# Patient Record
Sex: Male | Born: 1990 | Race: White | Hispanic: No | Marital: Married | Smoking: Former smoker
Health system: Southern US, Community
[De-identification: ages and names within clinical notes are randomized; demographics above are authoritative.]

---
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
## 2008-03-30 ENCOUNTER — Emergency Department: Payer: Self-pay | Admitting: Emergency Medicine

## 2008-09-11 ENCOUNTER — Emergency Department: Payer: Self-pay

## 2008-12-10 ENCOUNTER — Emergency Department: Payer: Self-pay | Admitting: Emergency Medicine

## 2010-02-09 ENCOUNTER — Emergency Department: Payer: Self-pay | Admitting: Emergency Medicine

## 2012-01-28 ENCOUNTER — Emergency Department: Payer: Self-pay | Admitting: Emergency Medicine

## 2012-01-28 LAB — COMPREHENSIVE METABOLIC PANEL
Albumin: 4.5 g/dL (ref 3.4–5.0)
Anion Gap: 10 (ref 7–16)
Bilirubin,Total: 0.3 mg/dL (ref 0.2–1.0)
Chloride: 100 mmol/L (ref 98–107)
EGFR (African American): >60
EGFR (Non-African Amer.): >60
Potassium: 4.1 mmol/L (ref 3.5–5.1)
SGOT(AST): 39 U/L — ABNORMAL HIGH (ref 15–37)
SGPT (ALT): 26 U/L
Total Protein: 8.3 g/dL — ABNORMAL HIGH (ref 6.4–8.2)

## 2012-01-28 LAB — CBC
HCT: 48.9 % (ref 40.0–52.0)
HGB: 16.4 g/dL (ref 13.0–18.0)
MCH: 29.9 pg (ref 26.0–34.0)
Platelet: 231 10*3/uL (ref 150–440)
RBC: 5.49 10*6/uL (ref 4.40–5.90)

## 2012-01-28 LAB — URINALYSIS, COMPLETE
Bacteria: NONE SEEN
Bilirubin,UR: NEGATIVE
Blood: NEGATIVE
Nitrite: NEGATIVE
Ph: 5 (ref 4.5–8.0)
RBC,UR: <1 /HPF (ref 0–5)
Specific Gravity: 1.026 (ref 1.003–1.030)
WBC UR: 1 /HPF (ref 0–5)

## 2012-01-28 LAB — LIPASE, BLOOD: Lipase: 121 U/L (ref 73–393)

## 2012-05-11 ENCOUNTER — Emergency Department: Payer: Self-pay | Admitting: Emergency Medicine

## 2012-05-14 LAB — BETA STREP CULTURE(ARMC)

## 2012-09-19 IMAGING — US ABDOMEN ULTRASOUND LIMITED
1 series · 17 of 25 positions shown · non-contrast
Comparison: none

REASON FOR EXAM: epigastric and rt flank pain + leukocytosis
COMMENTS:   Body Site: GB and Fossa, CBD, Head of Pancreas

[Series 1: abdomen ultrasound limited · 17 of 37 slices shown]
[im 1/37]
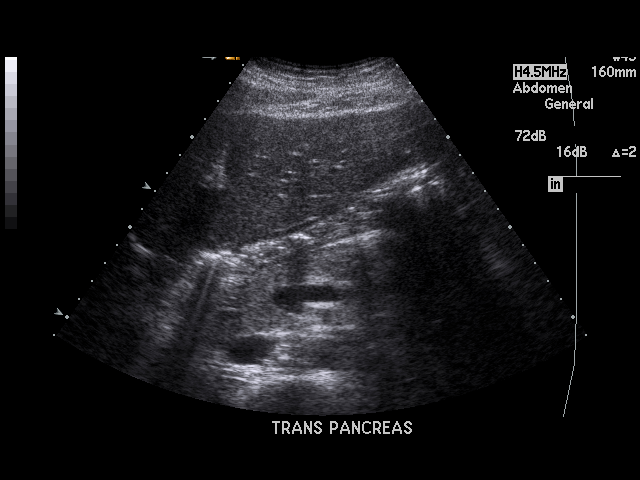
[im 4/37]
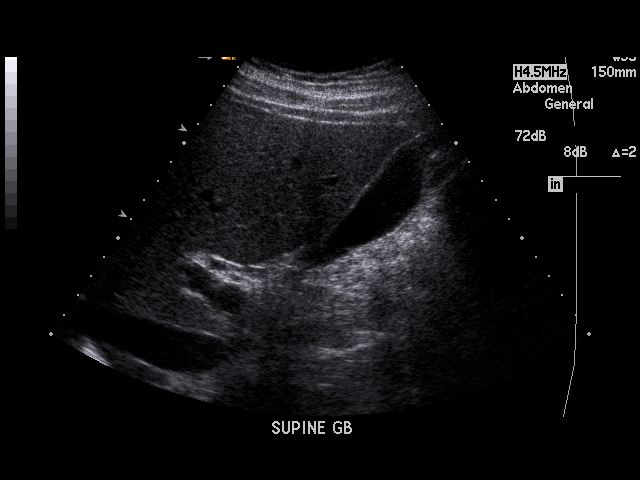
[im 5/37]
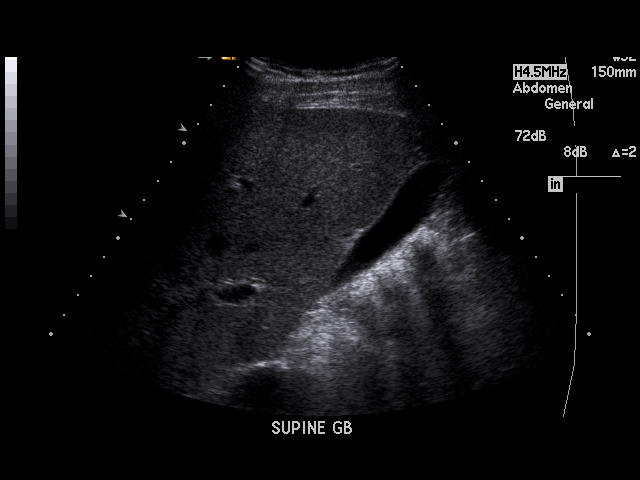
[im 8/37]
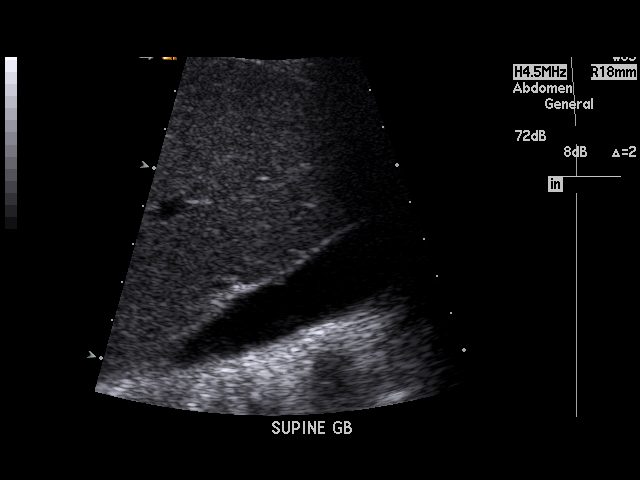
[im 10/37]
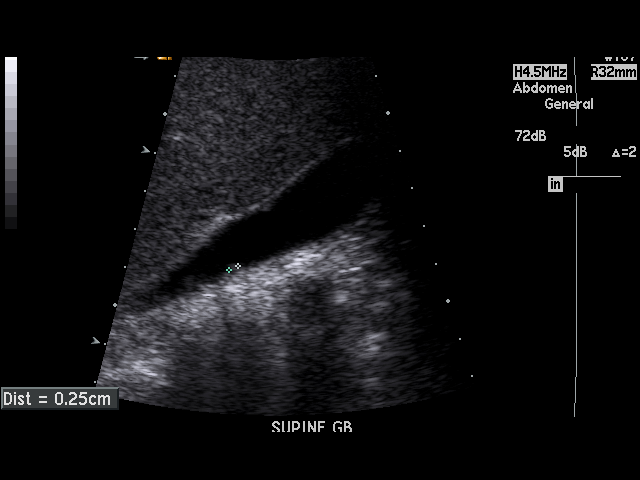
[im 13/37]
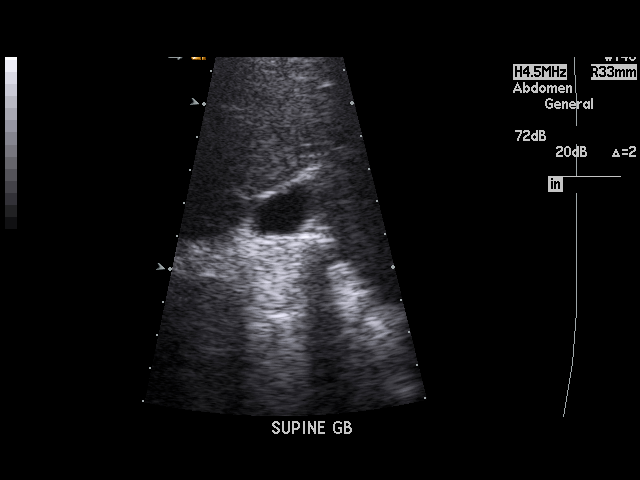
[im 14/37]
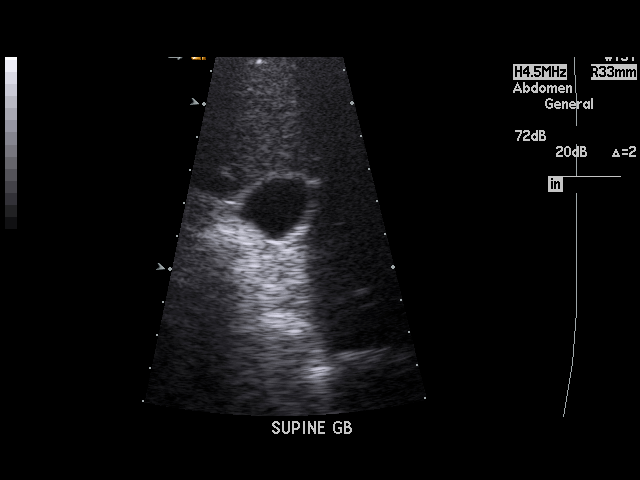
[im 17/37]
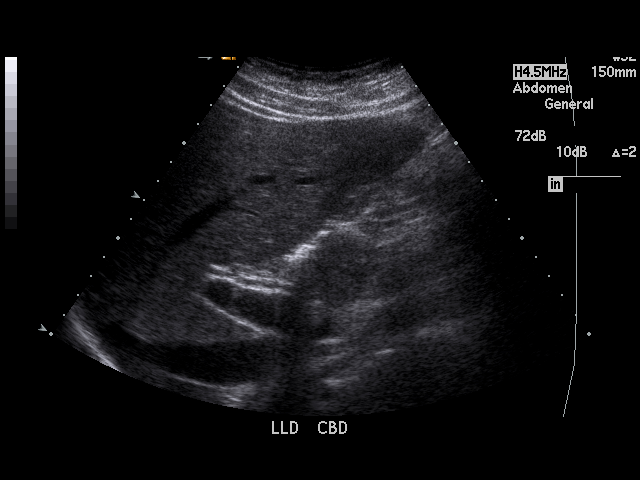
[im 19/37]
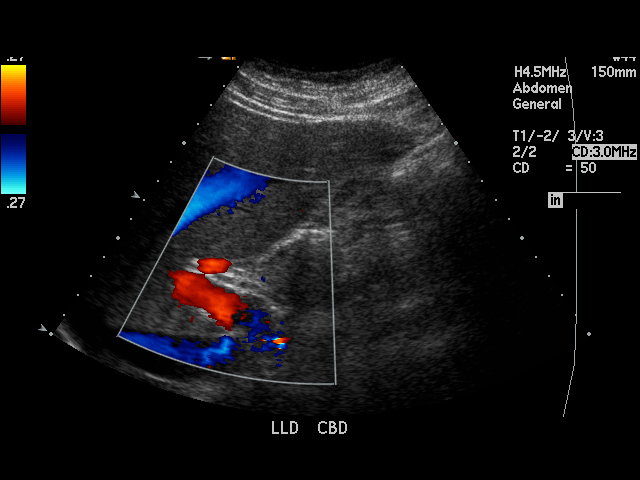
[im 20/37]
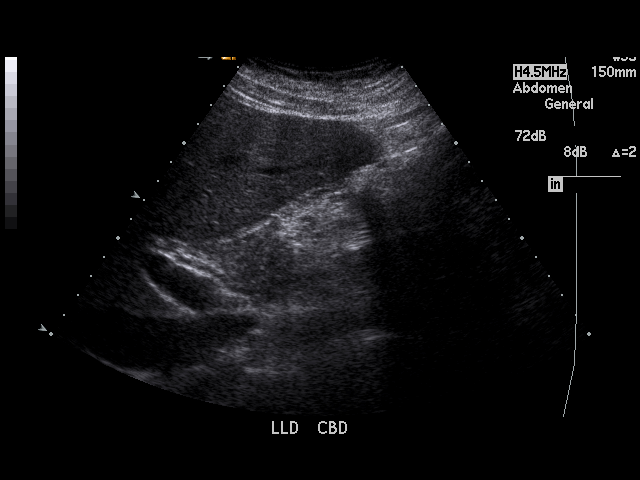
[im 23/37]
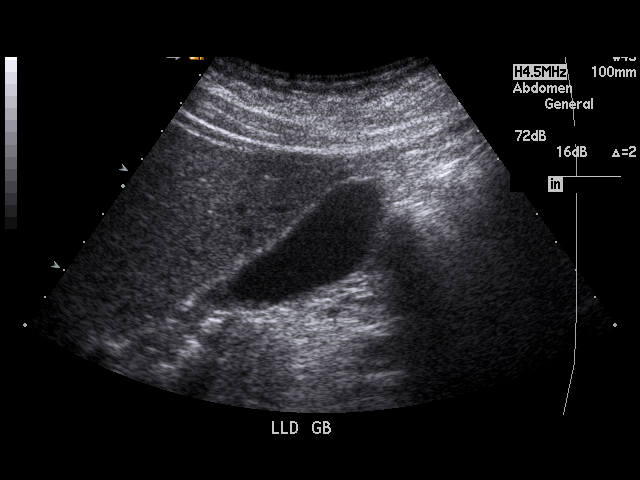
[im 25/37]
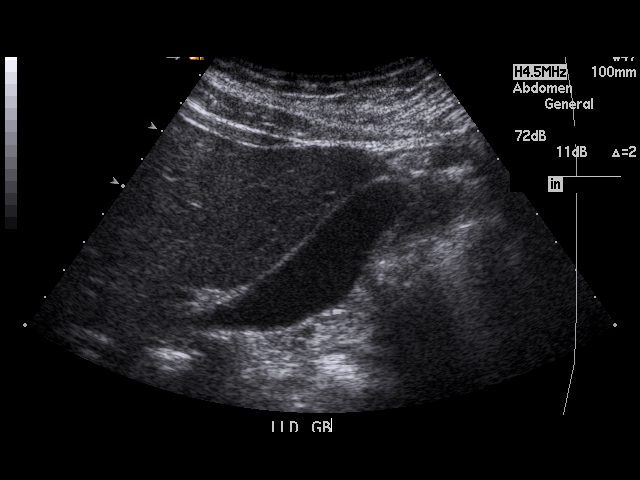
[im 28/37]
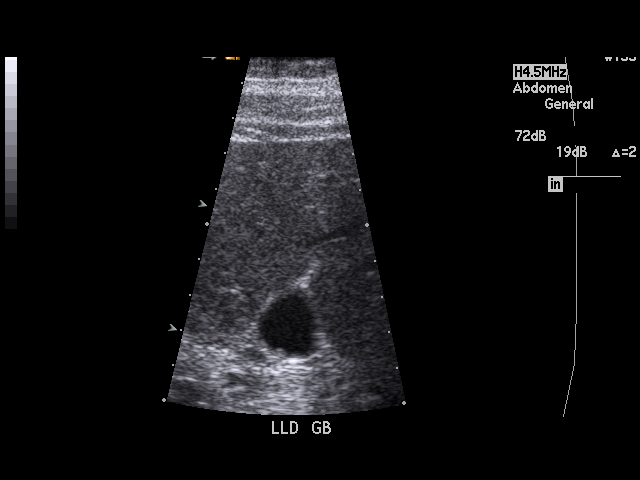
[im 29/37]
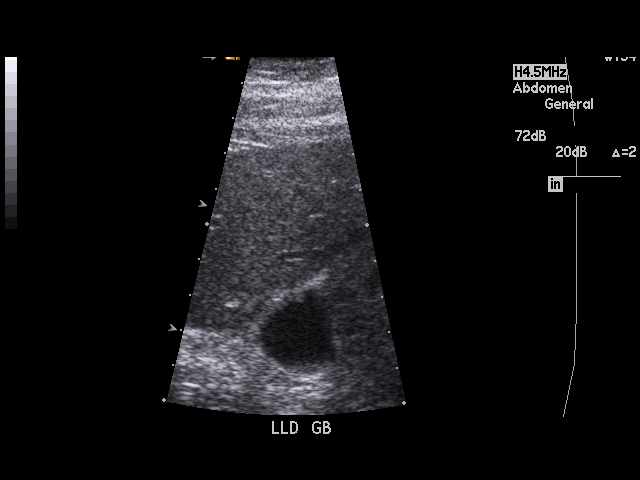
[im 32/37]
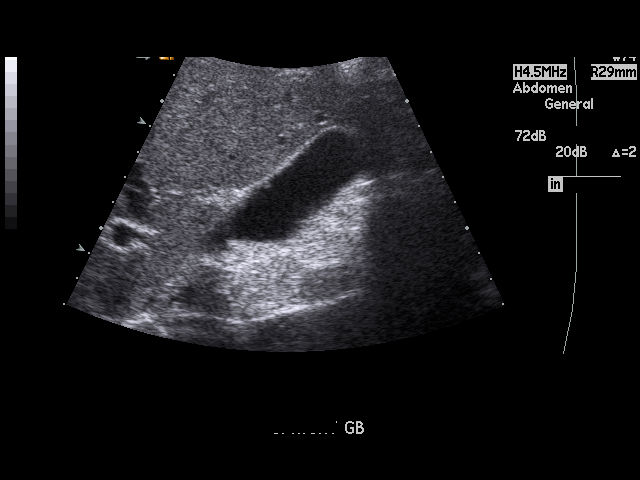
[im 34/37]
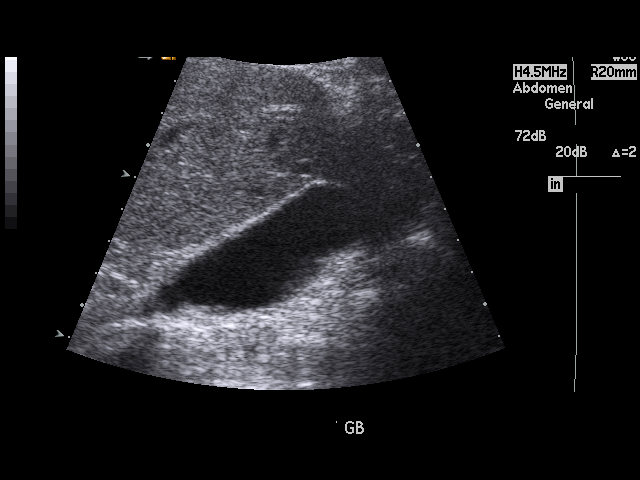
[im 37/37]
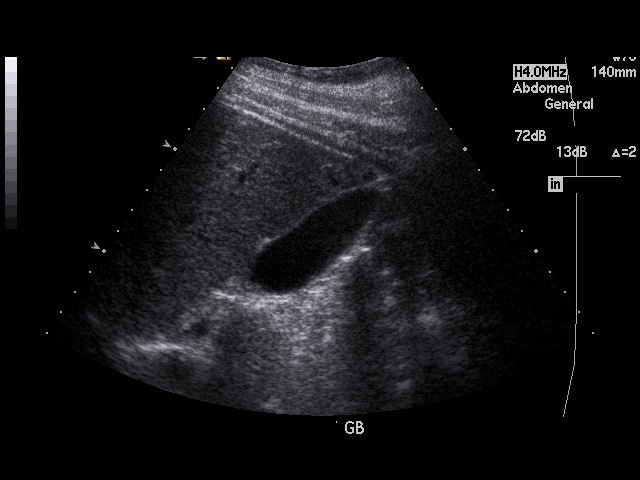

[17 of 25 positions shown; findings below may reference images not displayed]

PROCEDURE:     US  - US ABDOMEN LIMITED SURVEY  - January 29, 2012 [DATE]

RESULT:     Gallbladder ultrasound examination is performed. The study shows
a 3.0 mm nonshadowing echogenic area which is nonmobile along the
nondependent gallbladder surface consistent with a small polyp. There
appears to be some additional scattered similar structures less than 4 mm in
size. No sonographic Murphy's sign is evident. Portal venous flow is normal.
Common bile duct diameter is 3.0 mm. No pericholecystic fluid is present.
The gallbladder wall thickness is 1.4 mm. No sonographic Murphy's sign is
reported.
IMPRESSION: 1.     Multiple polyps.
2.     No cholelithiasis or evidence of acute cholecystitis.
3.     The visualized pancreas is unremarkable.

## 2013-03-30 ENCOUNTER — Emergency Department: Payer: Self-pay | Admitting: Unknown Physician Specialty

## 2013-03-30 LAB — COMPREHENSIVE METABOLIC PANEL
Albumin: 3.6 g/dL (ref 3.4–5.0)
Bilirubin,Total: 0.4 mg/dL (ref 0.2–1.0)
Calcium, Total: 8.6 mg/dL (ref 8.5–10.1)
Chloride: 104 mmol/L (ref 98–107)
Co2: 25 mmol/L (ref 21–32)
Creatinine: 0.9 mg/dL (ref 0.60–1.30)
Glucose: 89 mg/dL (ref 65–99)
Osmolality: 270 (ref 275–301)
SGPT (ALT): 19 U/L (ref 12–78)
Sodium: 136 mmol/L (ref 136–145)
Total Protein: 7.5 g/dL (ref 6.4–8.2)

## 2013-03-30 LAB — CBC
HCT: 39.9 % — ABNORMAL LOW (ref 40.0–52.0)
MCH: 29.3 pg (ref 26.0–34.0)
MCV: 85 fL (ref 80–100)
Platelet: 185 10*3/uL (ref 150–440)
RBC: 4.68 10*6/uL (ref 4.40–5.90)
RDW: 12.8 % (ref 11.5–14.5)

## 2013-03-30 LAB — MONONUCLEOSIS SCREEN: Mono Test: NEGATIVE

## 2013-03-30 LAB — RAPID INFLUENZA A&B ANTIGENS

## 2013-04-01 LAB — BETA STREP CULTURE(ARMC)

## 2013-07-31 ENCOUNTER — Emergency Department: Payer: Self-pay | Admitting: Emergency Medicine

## 2013-07-31 LAB — COMPREHENSIVE METABOLIC PANEL
Albumin: 4.2 g/dL (ref 3.4–5.0)
Anion Gap: 8 (ref 7–16)
BUN: 13 mg/dL (ref 7–18)
Bilirubin,Total: 0.3 mg/dL (ref 0.2–1.0)
Calcium, Total: 8.7 mg/dL (ref 8.5–10.1)
Co2: 26 mmol/L (ref 21–32)
EGFR (African American): >60
EGFR (Non-African Amer.): >60
Glucose: 114 mg/dL — ABNORMAL HIGH (ref 65–99)
Osmolality: 279 (ref 275–301)
SGOT(AST): 26 U/L (ref 15–37)
SGPT (ALT): 21 U/L (ref 12–78)
Sodium: 139 mmol/L (ref 136–145)
Total Protein: 7.7 g/dL (ref 6.4–8.2)

## 2013-07-31 LAB — URINALYSIS, COMPLETE
Ketone: NEGATIVE
Leukocyte Esterase: NEGATIVE
Ph: 6 (ref 4.5–8.0)
Protein: NEGATIVE
RBC,UR: <1 /HPF (ref 0–5)
Specific Gravity: 1.026 (ref 1.003–1.030)
Squamous Epithelial: <1

## 2013-07-31 LAB — CBC
HCT: 42.8 % (ref 40.0–52.0)
MCH: 29.2 pg (ref 26.0–34.0)
MCHC: 34.4 g/dL (ref 32.0–36.0)
Platelet: 216 10*3/uL (ref 150–440)
RDW: 13 % (ref 11.5–14.5)

## 2013-08-01 LAB — WBCS, STOOL

## 2013-08-03 LAB — STOOL CULTURE

## 2014-07-11 ENCOUNTER — Ambulatory Visit: Payer: Self-pay | Admitting: Family Medicine

## 2015-07-10 ENCOUNTER — Emergency Department
Admission: EM | Admit: 2015-07-10 | Discharge: 2015-07-10 | Disposition: A | Discharge status: Home / Self Care | Payer: BLUE CROSS/BLUE SHIELD | Attending: Emergency Medicine | Admitting: Emergency Medicine

## 2015-07-10 ENCOUNTER — Emergency Department: Payer: BLUE CROSS/BLUE SHIELD

## 2015-07-10 ENCOUNTER — Encounter: Payer: Self-pay | Admitting: Emergency Medicine

## 2015-07-10 DIAGNOSIS — S39012A Strain of muscle, fascia and tendon of lower back, initial encounter: Secondary | ICD-10-CM | POA: Diagnosis not present

## 2015-07-10 DIAGNOSIS — M545 Low back pain: Secondary | ICD-10-CM | POA: Diagnosis present

## 2015-07-10 DIAGNOSIS — Y998 Other external cause status: Secondary | ICD-10-CM | POA: Insufficient documentation

## 2015-07-10 DIAGNOSIS — Y9289 Other specified places as the place of occurrence of the external cause: Secondary | ICD-10-CM | POA: Diagnosis not present

## 2015-07-10 DIAGNOSIS — Y9389 Activity, other specified: Secondary | ICD-10-CM | POA: Insufficient documentation

## 2015-07-10 DIAGNOSIS — X58XXXA Exposure to other specified factors, initial encounter: Secondary | ICD-10-CM | POA: Insufficient documentation

## 2015-07-10 DIAGNOSIS — Z720 Tobacco use: Secondary | ICD-10-CM | POA: Insufficient documentation

## 2015-07-10 MED ORDER — IBUPROFEN 800 MG PO TABS: 800.0000 mg | ORAL_TABLET | Freq: Three times a day (TID) | ORAL | Status: DC | PRN

## 2015-07-10 MED ORDER — CYCLOBENZAPRINE HCL 10 MG PO TABS: 10.0000 mg | ORAL_TABLET | Freq: Three times a day (TID) | ORAL | Status: DC | PRN

## 2015-07-10 NOTE — Discharge Instructions (Signed)
Back Exercises °Back exercises help treat and prevent back injuries. The goal of back exercises is to increase the strength of your abdominal and back muscles and the flexibility of your back. These exercises should be started when you no longer have back pain. Back exercises include: °· Pelvic Tilt. Lie on your back with your knees bent. Tilt your pelvis until the lower part of your back is against the floor. Hold this position 5 to 10 sec and repeat 5 to 10 times. °· Knee to Chest. Pull first 1 knee up against your chest and hold for 20 to 30 seconds, repeat this with the other knee, and then both knees. This may be done with the other leg straight or bent, whichever feels better. °· Sit-Ups or Curl-Ups. Bend your knees 90 degrees. Start with tilting your pelvis, and do a partial, slow sit-up, lifting your trunk only 30 to 45 degrees off the floor. Take at least 2 to 3 seconds for each sit-up. Do not do sit-ups with your knees out straight. If partial sit-ups are difficult, simply do the above but with only tightening your abdominal muscles and holding it as directed. °· Hip-Lift. Lie on your back with your knees flexed 90 degrees. Push down with your feet and shoulders as you raise your hips a couple inches off the floor; hold for 10 seconds, repeat 5 to 10 times. °· Back arches. Lie on your stomach, propping yourself up on bent elbows. Slowly press on your hands, causing an arch in your low back. Repeat 3 to 5 times. Any initial stiffness and discomfort should lessen with repetition over time. °· Shoulder-Lifts. Lie face down with arms beside your body. Keep hips and torso pressed to floor as you slowly lift your head and shoulders off the floor. °Do not overdo your exercises, especially in the beginning. Exercises may cause you some mild back discomfort which lasts for a few minutes; however, if the pain is more severe, or lasts for more than 15 minutes, do not continue exercises until you see your caregiver.  Improvement with exercise therapy for back problems is slow.  °See your caregivers for assistance with developing a proper back exercise program. °Document Released: 11/24/2004 Document Revised: 01/09/2012 Document Reviewed: 08/18/2011 °ExitCare® Patient Information ©2015 [employer]. This information is not intended to replace advice given to you by your health care provider. Make sure you discuss any questions you have with your health care provider. ° °Back Pain, Adult °Low back pain is very common. About 1 in 5 people have back pain. The cause of low back pain is rarely dangerous. The pain often gets better over time. About half of people with a sudden onset of back pain feel better in just 2 weeks. About 8 in 10 people feel better by 6 weeks.  °CAUSES °Some common causes of back pain include: °· Strain of the muscles or ligaments supporting the spine. °· Wear and tear (degeneration) of the spinal discs. °· Arthritis. °· Direct injury to the back. °DIAGNOSIS °Most of the time, the direct cause of low back pain is not known. However, back pain can be treated effectively even when the exact cause of the pain is unknown. Answering your caregiver's questions about your overall health and symptoms is one of the most accurate ways to make sure the cause of your pain is not dangerous. If your caregiver needs more information, he or she may order lab work or imaging tests (X-rays or MRIs). However, even if imaging tests show changes in your   back, this usually does not require surgery. °HOME CARE INSTRUCTIONS °For many people, back pain returns. Since low back pain is rarely dangerous, it is often a condition that people can learn to manage on their own.  °· Remain active. It is stressful on the back to sit or stand in one place. Do not sit, drive, or stand in one place for more than 30 minutes at a time. Take short walks on level surfaces as soon as pain allows. Try to increase the length of time you walk each  day. °· Do not stay in bed. Resting more than 1 or 2 days can delay your recovery. °· Do not avoid exercise or work. Your body is made to move. It is not dangerous to be active, even though your back may hurt. Your back will likely heal faster if you return to being active before your pain is gone. °· Pay attention to your body when you  bend and lift. Many people have less discomfort when lifting if they bend their knees, keep the load close to their bodies, and avoid twisting. Often, the most comfortable positions are those that put less stress on your recovering back. °· Find a comfortable position to sleep. Use a firm mattress and lie on your side with your knees slightly bent. If you lie on your back, put a pillow under your knees. °· Only take over-the-counter or prescription medicines as directed by your caregiver. Over-the-counter medicines to reduce pain and inflammation are often the most helpful. Your caregiver may prescribe muscle relaxant drugs. These medicines help dull your pain so you can more quickly return to your normal activities and healthy exercise. °· Put ice on the injured area. °¨ Put ice in a plastic bag. °¨ Place a towel between your skin and the bag. °¨ Leave the ice on for 15-20 minutes, 03-04 times a day for the first 2 to 3 days. After that, ice and heat may be alternated to reduce pain and spasms. °· Ask your caregiver about trying back exercises and gentle massage. This may be of some benefit. °· Avoid feeling anxious or stressed. Stress increases muscle tension and can worsen back pain. It is important to recognize when you are anxious or stressed and learn ways to manage it. Exercise is a great option. °SEEK MEDICAL CARE IF: °· You have pain that is not relieved with rest or medicine. °· You have pain that does not improve in 1 week. °· You have new symptoms. °· You are generally not feeling well. °SEEK IMMEDIATE MEDICAL CARE IF:  °· You have pain that radiates from your back into  your legs. °· You develop new bowel or bladder control problems. °· You have unusual weakness or numbness in your arms or legs. °· You develop nausea or vomiting. °· You develop abdominal pain. °· You feel faint. °Document Released: 10/17/2005 Document Revised: 04/17/2012 Document Reviewed: 02/18/2014 °ExitCare® Patient Information ©2015 [employer]. This information is not intended to replace advice given to you by your health care provider. Make sure you discuss any questions you have with your health care provider. ° °Lumbosacral Strain °Lumbosacral strain is a strain of any of the parts that make up your lumbosacral vertebrae. Your lumbosacral vertebrae are the bones that make up the lower third of your backbone. Your lumbosacral vertebrae are held together by muscles and tough, fibrous tissue (ligaments).  °CAUSES  °A sudden blow to your back can cause lumbosacral strain. Also, anything that causes an excessive stretch of the muscles in the low back can cause this strain. This is typically   seen when people exert themselves strenuously, fall, lift heavy objects, bend, or crouch repeatedly. °RISK FACTORS °· Physically demanding work. °· Participation in pushing or pulling sports or sports that require a sudden twist of the back (tennis, golf, baseball). °· Weight lifting. °· Excessive lower back curvature. °· Forward-tilted pelvis. °· Weak back or abdominal muscles or both. °· Tight hamstrings. °SIGNS AND SYMPTOMS  °Lumbosacral strain may cause pain in the area of your injury or pain that moves (radiates) down your leg.  °DIAGNOSIS °Your health care provider can often diagnose lumbosacral strain through a physical exam. In some cases, you may need tests such as X-ray exams.  °TREATMENT  °Treatment for your lower back injury depends on many factors that your clinician will have to evaluate. However, most treatment will include the use of anti-inflammatory medicines. °HOME CARE INSTRUCTIONS  °· Avoid hard  physical activities (tennis, racquetball, waterskiing) if you are not in proper physical condition for it. This may aggravate or create problems. °· If you have a back problem, avoid sports requiring sudden body movements. Swimming and walking are generally safer activities. °· Maintain good posture. °· Maintain a healthy weight. °· For acute conditions, you may put ice on the injured area. °¨ Put ice in a plastic bag. °¨ Place a towel between your skin and the bag. °¨ Leave the ice on for 20 minutes, 2-3 times a day. °· When the low back starts healing, stretching and strengthening exercises may be recommended. °SEEK MEDICAL CARE IF: °· Your back pain is getting worse. °· You experience severe back pain not relieved with medicines. °SEEK IMMEDIATE MEDICAL CARE IF:  °· You have numbness, tingling, weakness, or problems with the use of your arms or legs. °· There is a change in bowel or bladder control. °· You have increasing pain in any area of the body, including your belly (abdomen). °· You notice shortness of breath, dizziness, or feel faint. °· You feel sick to your stomach (nauseous), are throwing up (vomiting), or become sweaty. °· You notice discoloration of your toes or legs, or your feet get very cold. °MAKE SURE YOU:  °· Understand these instructions. °· Will watch your condition. °· Will get help right away if you are not doing well or get worse. °Document Released: 07/27/2005 Document Revised: 10/22/2013 Document Reviewed: 06/05/2013 °ExitCare® Patient Information ©2015 [employer]. This information is not intended to replace advice given to you by your health care provider. Make sure you discuss any questions you have with your health care provider. ° °

## 2015-07-10 NOTE — ED Notes (Signed)
Reports lower back pain, worse with movement. Ambulates well.

## 2015-07-10 NOTE — ED Notes (Signed)
Developed lower back pain about 1 month ago with unknown injury  Ambulates well to treatment area

## 2015-07-10 NOTE — ED Provider Notes (Signed)
[facility] Emergency Department Provider Note  ____________________________________________  Time seen: Approximately 10:34 AM  I have reviewed the triage vital signs and the nursing notes.   HISTORY  Chief Complaint Back Pain    HPI Craig Frazier is a 24 y.o. male who presents with a month-long history of low back pain. Patient states he does not like to go to the doctor so hasn't had it checked out and needs a referral to a specialist for his insurance. Denies any direct trauma. 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   History reviewed. No pertinent past medical history.  There are no active problems to display for this patient.   History reviewed. No pertinent past surgical history.  Current Outpatient Rx  Name  Route  Sig  Dispense  Refill  . cyclobenzaprine (FLEXERIL) 10 MG tablet   Oral   Take 1 tablet (10 mg total) by mouth every 8 (eight) hours as needed for muscle spasms.   30 tablet   1   . ibuprofen (ADVIL,MOTRIN) 800 MG tablet   Oral   Take 1 tablet (800 mg total) by mouth every 8 (eight) hours as needed.   30 tablet   0     Allergies Review of patient's allergies indicates no known allergies.  History reviewed. No pertinent family history.  Social History Social History  Substance Use Topics  . Smoking status: Current Some Day Smoker  . Smokeless tobacco: None  . Alcohol Use: No    Review of Systems Constitutional: No fever/chills Eyes: No visual changes. ENT: No sore throat. Cardiovascular: Denies chest pain. Respiratory: Denies shortness of breath. Gastrointestinal: No abdominal pain.  No nausea, no vomiting.  No diarrhea.  No constipation. Genitourinary: Negative for dysuria. Musculoskeletal: Positive for low back pain. Skin: Negative for rash. Neurological: Negative for headaches, focal weakness or numbness.  10-point ROS otherwise  negative.  ____________________________________________   PHYSICAL EXAM:  VITAL SIGNS: ED Triage Vitals  Enc Vitals Group     BP 07/10/15 0942 143/74 mmHg     Pulse Rate 07/10/15 0942 86     Resp 07/10/15 0942 18     Temp 07/10/15 0942 98.4 F (36.9 C)     Temp Source 07/10/15 0942 Oral     SpO2 07/10/15 0942 100 %     Weight 07/10/15 0937 240 lb (108.863 kg)     Height 07/10/15 0937  (1.854 m)     Head Cir --      Peak Flow --      Pain Score 07/10/15 0938 7     Pain Loc --      Pain Edu? --      Excl. in GC? --     Constitutional: Alert and oriented. Well appearing and in no acute distress. Cardiovascular: Normal rate, regular rhythm. Grossly normal heart sounds.  Good peripheral circulation. Respiratory: Normal respiratory effort.  No retractions. Lungs CTAB. Musculoskeletal: No lower extremity tenderness nor edema.  No joint effusions. Paraspinal tenderness in the lumbar area. Straight leg raise negative. Negative spinal tenderness. Neurologic:  Normal speech and language. No gross focal neurologic deficits are appreciated. No gait instability. Skin:  Skin is warm, dry and intact. No rash noted. Psychiatric: Mood and affect are normal. Speech and behavior are normal.  ____________________________________________   LABS (all labs ordered are listed, but only abnormal results are displayed)  Labs Reviewed - No data to display  RADIOLOGY  Negative  for fracture dislocationchanges. ____________________________________________   PROCEDURES  Procedure(s) performed: None  Critical Care performed: No  ____________________________________________   INITIAL IMPRESSION / ASSESSMENT AND PLAN / ED COURSE  Pertinent labs & imaging results that were available during my care of the patient were reviewed by me and considered in my medical decision making (see chart for details).  Acute lumbar sacral strain. Rx given for Motrin 800 mg 3 times a day, Flexeril 10 mg  3 times a day referral given to orthopedic on call at patient's request. Work note given times one day.  Patient voices no other emergency medical complaints at this time. ____________________________________________   FINAL CLINICAL IMPRESSION(S) / ED DIAGNOSES  Final diagnoses:  Lumbar strain, initial encounter      Evangeline Dakin, PA-C 07/10/15 1204  Sharman Cheek, MD 07/10/15 [phone]

## 2019-11-13 ENCOUNTER — Ambulatory Visit: Payer: BLUE CROSS/BLUE SHIELD | Attending: Internal Medicine

## 2020-10-30 ENCOUNTER — Emergency Department: Payer: BC Managed Care – PPO

## 2020-10-30 ENCOUNTER — Other Ambulatory Visit: Payer: Self-pay

## 2020-10-30 ENCOUNTER — Emergency Department
Admission: EM | Admit: 2020-10-30 | Discharge: 2020-10-30 | Disposition: A | Discharge status: Home / Self Care | Payer: BC Managed Care – PPO | Attending: Student in an Organized Health Care Education/Training Program | Admitting: Student in an Organized Health Care Education/Training Program

## 2020-10-30 DIAGNOSIS — U071 COVID-19: Secondary | ICD-10-CM | POA: Diagnosis not present

## 2020-10-30 DIAGNOSIS — F17200 Nicotine dependence, unspecified, uncomplicated: Secondary | ICD-10-CM | POA: Diagnosis not present

## 2020-10-30 DIAGNOSIS — K449 Diaphragmatic hernia without obstruction or gangrene: Secondary | ICD-10-CM | POA: Insufficient documentation

## 2020-10-30 DIAGNOSIS — R509 Fever, unspecified: Secondary | ICD-10-CM | POA: Diagnosis present

## 2020-10-30 LAB — POC SARS CORONAVIRUS 2 AG -  ED
SARS Coronavirus 2 Ag: NEGATIVE
SARS Coronavirus 2 Ag: NEGATIVE

## 2020-10-30 LAB — RESP PANEL BY RT-PCR (FLU A&B, COVID) ARPGX2
Influenza A by PCR: NEGATIVE
Influenza B by PCR: NEGATIVE
SARS Coronavirus 2 by RT PCR: POSITIVE — AB

## 2020-10-30 MED ORDER — IBUPROFEN 600 MG PO TABS: 600.0000 mg | ORAL_TABLET | Freq: Four times a day (QID) | ORAL | 0 refills | Status: DC | PRN

## 2020-10-30 MED ORDER — BENZONATATE 100 MG PO CAPS: 100.0000 mg | ORAL_CAPSULE | Freq: Three times a day (TID) | ORAL | 0 refills | Status: AC | PRN

## 2020-10-30 MED ORDER — IBUPROFEN 400 MG PO TABS
400.0000 mg | ORAL_TABLET | Freq: Once | ORAL | Status: AC
  Administered 2020-10-30: 400 mg via ORAL
  Filled 2020-10-30: qty 1

## 2020-10-30 NOTE — ED Triage Notes (Signed)
Pt presents via POV c/o fever x2 days, Reports fever this am at 0400 of 103. Reports took Tylenol at 0400.

## 2020-10-30 NOTE — ED Provider Notes (Signed)
[facility] Emergency Department Provider Note  ____________________________________________  Time seen: Approximately 11:13 AM  I have reviewed the triage vital signs and the nursing notes.   HISTORY  Chief Complaint Fever    HPI Craig Frazier is a 29 y.o. male that presents to the emergency department for evaluation of fever, headache, shortness of breath since yesterday.  Patient states fever in the middle of the night was 103.  Patient's throat is "dry" but not sore.  Patient had Lyme's disease 7 years ago and wondering if he caught it again. No tick bites.  No sick contacts.  He has not had a Covid vaccination.  No nasal congestion, sore throat, cough, vomiting, diarrhea.  History reviewed. No pertinent past medical history.  There are no problems to display for this patient.   History reviewed. No pertinent surgical history.  Prior to Admission medications   Medication Sig Start Date End Date Taking? Authorizing Provider  benzonatate (TESSALON PERLES) 100 MG capsule Take 1 capsule (100 mg total) by mouth 3 (three) times daily as needed. 10/30/20 10/30/21 Yes Enid Derry, PA-C  ibuprofen (ADVIL) 600 MG tablet Take 1 tablet (600 mg total) by mouth every 6 (six) hours as needed. 10/30/20  Yes Enid Derry, PA-C  cyclobenzaprine (FLEXERIL) 10 MG tablet Take 1 tablet (10 mg total) by mouth every 8 (eight) hours as needed for muscle spasms. 07/10/15   Beers, Charmayne Sheer, PA-C    Allergies Patient has no known allergies.  History reviewed. No pertinent family history.  Social History Social History   Tobacco Use  . Smoking status: Current Some Day Smoker  . Smokeless tobacco: Current User    Types: Snuff  Vaping Use  . Vaping Use: Every day  Substance Use Topics  . Alcohol use: No     Review of Systems  Constitutional: Positive for fever/chills Eyes: No visual changes. No discharge. ENT: Positive for congestion and  rhinorrhea. Cardiovascular: No chest pain. Respiratory: Negative for cough. Positive for SOB. Gastrointestinal: No abdominal pain.  No nausea, no vomiting.  No diarrhea.  No constipation. Musculoskeletal: Negative for musculoskeletal pain. Skin: Negative for rash, abrasions, lacerations, ecchymosis. Neurological: Positive for headache.   ____________________________________________   PHYSICAL EXAM:  VITAL SIGNS: ED Triage Vitals  Enc Vitals Group     BP 10/30/20 0942 129/74     Pulse Rate 10/30/20 0942 66     Resp 10/30/20 0942 20     Temp 10/30/20 0942 (!) 100.6 F (38.1 C)     Temp Source 10/30/20 0942 Oral     SpO2 10/30/20 0942 97 %     Weight 10/30/20 0943 270 lb (122.5 kg)     Height 10/30/20 0943 6\' 6"  (1.981 m)     Head Circumference --      Peak Flow --      Pain Score 10/30/20 0946 7     Pain Loc --      Pain Edu? --      Excl. in GC? --      Constitutional: Alert and oriented. Well appearing and in no acute distress. Eyes: Conjunctivae are normal. PERRL. EOMI. No discharge. Head: Atraumatic. ENT: No frontal and maxillary sinus tenderness.      Ears: Tympanic membranes pearly gray with good landmarks. No discharge.      Nose: No congestion/rhinnorhea.      Mouth/Throat: Mucous membranes are moist. Oropharynx non-erythematous. Tonsils not enlarged. No exudates. Uvula midline. Neck: No stridor.   Hematological/Lymphatic/Immunilogical:  No cervical lymphadenopathy. Cardiovascular: Normal rate, regular rhythm.  Good peripheral circulation. Respiratory: Normal respiratory effort without tachypnea or retractions. Lungs CTAB. Good air entry to the bases with no decreased or absent breath sounds. Gastrointestinal: Bowel sounds 4 quadrants. Soft and nontender to palpation. No guarding or rigidity. No palpable masses. No distention. Musculoskeletal: Full range of motion to all extremities. No gross deformities appreciated. Neurologic:  Normal speech and language. No  gross focal neurologic deficits are appreciated.  Skin:  Skin is warm, dry and intact. No rash noted. Psychiatric: Mood and affect are normal. Speech and behavior are normal. Patient exhibits appropriate insight and judgement.   ____________________________________________   LABS (all labs ordered are listed, but only abnormal results are displayed)  Labs Reviewed  RESP PANEL BY RT-PCR (FLU A&B, COVID) ARPGX2 - Abnormal; Notable for the following components:      Result Value   SARS Coronavirus 2 by RT PCR POSITIVE (*)    All other components within normal limits  POC SARS CORONAVIRUS 2 AG -  ED   ____________________________________________  EKG   ____________________________________________  RADIOLOGY Lexine Baton, personally viewed and evaluated these images (plain radiographs) as part of my medical decision making, as well as reviewing the written report by the radiologist.  DG Chest 1 View  Result Date: 10/30/2020 CLINICAL DATA:  Onset cough, headache, fever and shortness of breath yesterday. EXAM: CHEST  1 VIEW COMPARISON:  None. FINDINGS: The lungs are clear. Heart size is normal. Hiatal hernia noted. No pneumothorax or pleural effusion. No acute or focal bony abnormality. IMPRESSION: No acute disease. Hiatal hernia. Electronically Signed   By: Drusilla Kanner M.D.   On: 10/30/2020 11:30    ____________________________________________    PROCEDURES  Procedure(s) performed:    Procedures    Medications  ibuprofen (ADVIL) tablet 400 mg (400 mg Oral Given 10/30/20 0950)     ____________________________________________   INITIAL IMPRESSION / ASSESSMENT AND PLAN / ED COURSE  Pertinent labs & imaging results that were available during my care of the patient were reviewed by me and considered in my medical decision making (see chart for details).  Review of the [place] CSRS was performed in accordance of the [facility] prior to dispensing any controlled  drugs.   Patient's diagnosis is consistent with Covid 19. Vital signs and exam are reassuring. Covid test is positive. Chest xray negative for acute cardiopulmonary processes. Patient appears well and is staying well hydrated. Patient should alternate tylenol and ibuprofen for fever. Patient feels comfortable going home. Patient will be discharged home with prescriptions for tessalon perles and ibuprofen. Patient is to follow up with PCP or urgent care as needed or otherwise directed. Patient is given ED precautions to return to the ED for any worsening or new symptoms.    Craig Frazier was evaluated in Emergency Department on 10/30/2020 for the symptoms described in the history of present illness. He was evaluated in the context of the global COVID-19 pandemic, which necessitated consideration that the patient might be at risk for infection with the SARS-CoV-2 virus that causes COVID-19. Institutional protocols and algorithms that pertain to the evaluation of patients at risk for COVID-19 are in a state of rapid change based on information released by regulatory bodies including the CDC and federal and state organizations. These policies and algorithms were followed during the patient's care in the ED.  ____________________________________________  FINAL CLINICAL IMPRESSION(S) / ED DIAGNOSES  Final diagnoses:  COVID-19      NEW  MEDICATIONS STARTED DURING THIS VISIT:  ED Discharge Orders         Ordered    ibuprofen (ADVIL) 600 MG tablet  Every 6 hours PRN        10/30/20 1219    benzonatate (TESSALON PERLES) 100 MG capsule  3 times daily PRN        10/30/20 1219              This chart was dictated using voice recognition software/Dragon. Despite best efforts to proofread, errors can occur which can change the meaning. Any change was purely unintentional.    Enid Derry, PA-C 10/30/20 1308    Willy Eddy, MD 10/30/20 [phone]

## 2020-10-30 NOTE — ED Notes (Signed)
See triage note. Pt sitting calmly in bed. Skin dry. Resp reg/unlabored. Reports palpitations at times. States last time he felt this terrible he found out he had limes disease and was placed on antibiotics.

## 2020-10-30 NOTE — ED Notes (Signed)
EKG to EDP Kinner in person.  °

## 2021-06-22 IMAGING — DX DG CHEST 1V
1 series · 1 of 1 positions shown · non-contrast
Comparison: None.

CLINICAL DATA: Onset cough, headache, fever and shortness of breath
yesterday.

EXAM:
CHEST  1 VIEW

[chest ap]
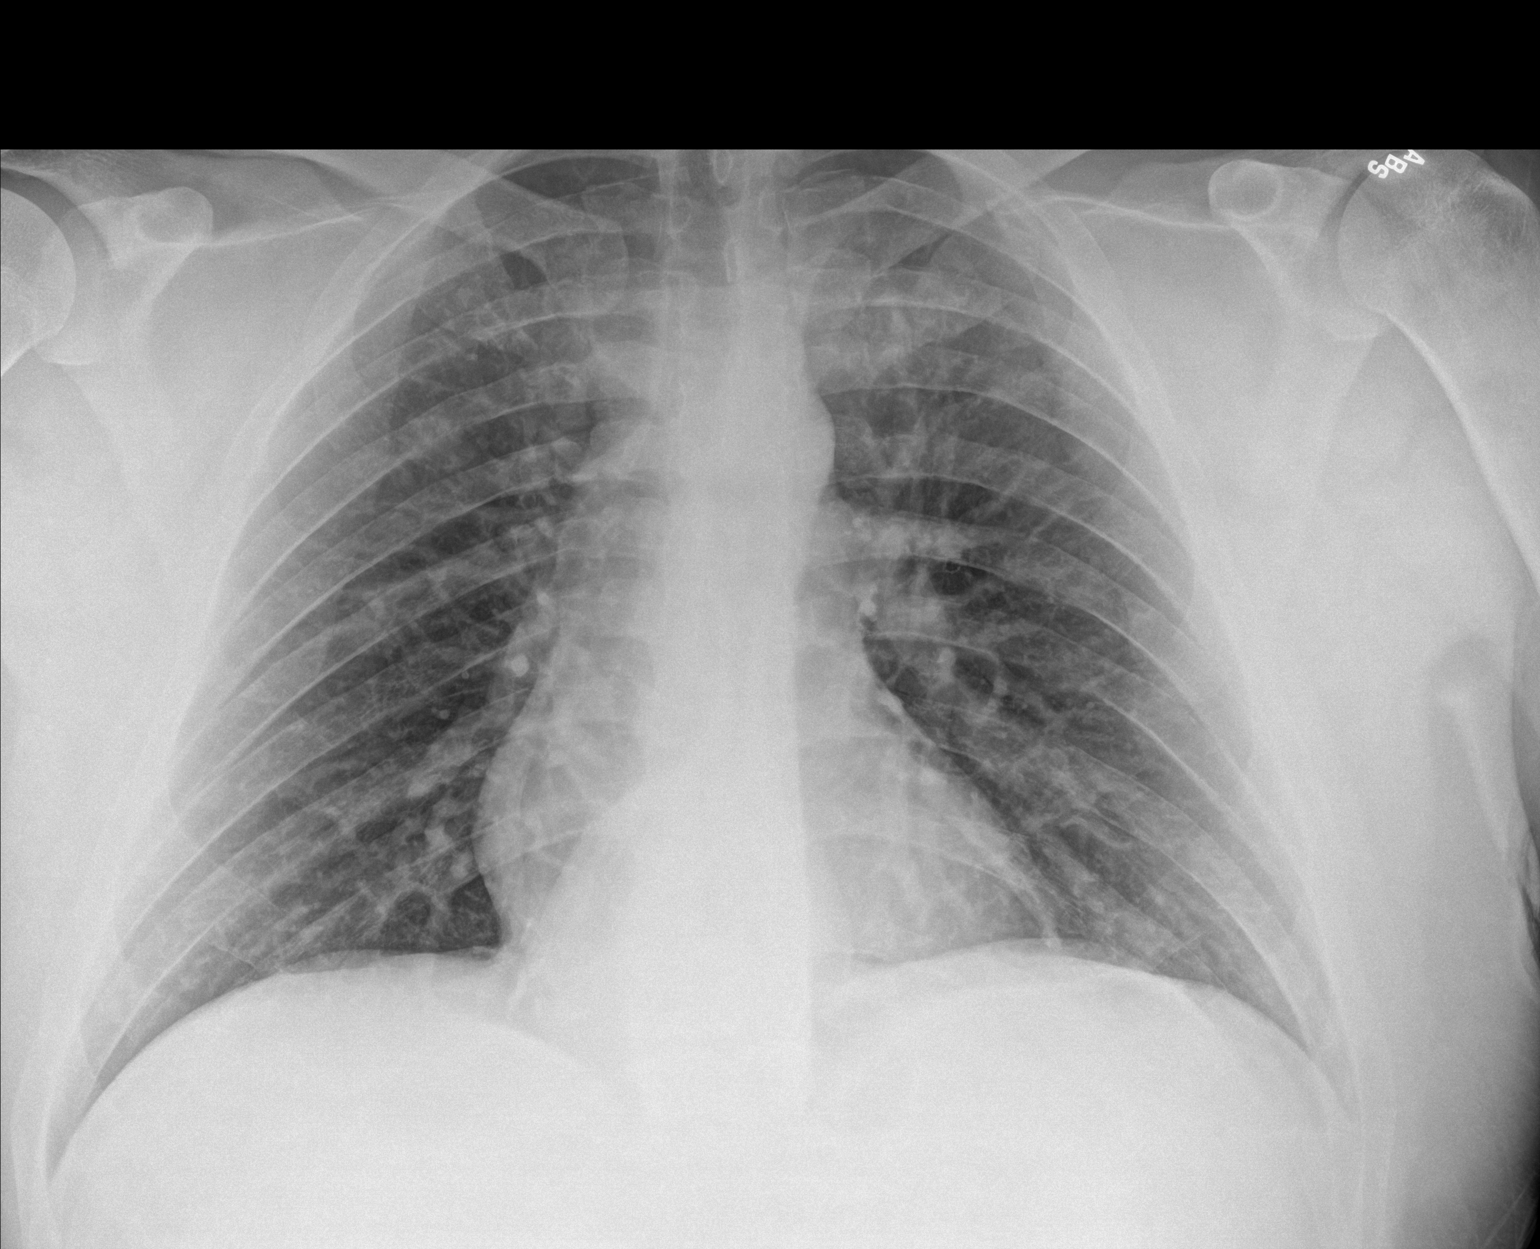

[1 of 1 positions shown; findings below may reference images not displayed]

FINDINGS: The lungs are clear. Heart size is normal. Hiatal hernia noted. No
pneumothorax or pleural effusion. No acute or focal bony
abnormality.
IMPRESSION: No acute disease.

Hiatal hernia.

## 2022-01-23 ENCOUNTER — Other Ambulatory Visit: Payer: Self-pay

## 2022-01-23 ENCOUNTER — Emergency Department
Admission: EM | Admit: 2022-01-23 | Discharge: 2022-01-23 | Disposition: A | Discharge status: Home / Self Care | Payer: BC Managed Care – PPO | Attending: Emergency Medicine | Admitting: Emergency Medicine

## 2022-01-23 ENCOUNTER — Encounter: Payer: Self-pay | Admitting: Emergency Medicine

## 2022-01-23 DIAGNOSIS — J020 Streptococcal pharyngitis: Secondary | ICD-10-CM | POA: Diagnosis not present

## 2022-01-23 DIAGNOSIS — Z20822 Contact with and (suspected) exposure to covid-19: Secondary | ICD-10-CM | POA: Diagnosis not present

## 2022-01-23 DIAGNOSIS — J029 Acute pharyngitis, unspecified: Secondary | ICD-10-CM | POA: Diagnosis present

## 2022-01-23 LAB — RESP PANEL BY RT-PCR (FLU A&B, COVID) ARPGX2
Influenza A by PCR: NEGATIVE
Influenza B by PCR: NEGATIVE
SARS Coronavirus 2 by RT PCR: NEGATIVE

## 2022-01-23 LAB — GROUP A STREP BY PCR: Group A Strep by PCR: DETECTED — AB

## 2022-01-23 MED ORDER — SODIUM CHLORIDE 0.9 % IV SOLN
3.0000 g | Freq: Once | INTRAVENOUS | Status: AC
  Administered 2022-01-23: 3 g via INTRAVENOUS
  Filled 2022-01-23: qty 8

## 2022-01-23 MED ORDER — AMOXICILLIN 875 MG PO TABS: 875.0000 mg | ORAL_TABLET | Freq: Two times a day (BID) | ORAL | 0 refills | Status: DC

## 2022-01-23 MED ORDER — DEXAMETHASONE SODIUM PHOSPHATE 10 MG/ML IJ SOLN
10.0000 mg | Freq: Once | INTRAMUSCULAR | Status: AC
  Administered 2022-01-23: 10 mg via INTRAVENOUS
  Filled 2022-01-23: qty 1

## 2022-01-23 NOTE — ED Notes (Signed)
AVS with prescriptions provided to and discussed with patient. Pt verbalizes understanding of discharge instructions and denies any questions or concerns at this time. Pt ambulated out of department independently with steady gait. ? ?

## 2022-01-23 NOTE — ED Triage Notes (Signed)
Pt reports sore throat and fever since Friday. Pt states has been using OTC meds to control fever. Denies recent exposures ?

## 2022-01-23 NOTE — ED Notes (Signed)
Pt stated he wanted food. ?

## 2022-01-23 NOTE — ED Provider Notes (Signed)
? ?[facility] ?Provider Note ? ? ? Event Date/Time  ? First MD Initiated Contact with Patient 01/23/22 0945   ?  (approximate) ? ? ?History  ? ?Sore Throat and Fever ? ? ?HPI ? ?Craig Frazier is a 31 y.o. male   to the ED with complaint of sore throat and fever for the last 2 days.  Patient states he is unaware of any known exposure to COVID or influenza.  Patient states he has been using over-the-counter medication to control his fever.  No one else in the family is sick at this time.  Patient reports that this is the worst sore throat he thinks he is ever had.  Patient denies any health problems.  He rates his pain as 10/10. ? ?  ? ? ?Physical Exam  ? ?Triage Vital Signs: ?ED Triage Vitals  ?Enc Vitals Group  ?   BP 01/23/22 0942 131/77  ?   Pulse Rate 01/23/22 0942 (!) 112  ?   Resp 01/23/22 0942 17  ?   Temp 01/23/22 0942 98.6 ?F (37 ?C)  ?   Temp Source 01/23/22 0942 Oral  ?   SpO2 01/23/22 0942 99 %  ?   Weight 01/23/22 0936 271 lb (122.9 kg)  ?   Height 01/23/22 0936 6\' 1"  (1.854 m)  ?   Head Circumference --   ?   Peak Flow --   ?   Pain Score 01/23/22 0936 10  ?   Pain Loc --   ?   Pain Edu? --   ?   Excl. in GC? --   ? ? ?Most recent vital signs: ?Vitals:  ? 01/23/22 0942 01/23/22 1229  ?BP: 131/77 130/70  ?Pulse: (!) 112 99  ?Resp: 17 16  ?Temp: 98.6 ?F (37 ?C) 98.4 ?F (36.9 ?C)  ?SpO2: 99% 99%  ? ? ? ?General: Awake, no distress.  Able to speak in complete sentences and swallowing saliva without difficulty. ?CV:  Good peripheral perfusion.  Heart regular rate and rhythm. ?Resp:  Normal effort.  Clear bilaterally. ?Abd:  No distention.  ?Other:  Posterior pharynx with enlarged tonsils bilaterally and erythema.  No exudate is appreciated.  Uvula is midline.  Neck is supple with moderate tenderness palpation cervical adenopathy. ? ? ?ED Results / Procedures / Treatments  ? ?Labs ?(all labs ordered are listed, but only abnormal results are displayed) ?Labs Reviewed  ?GROUP A STREP  BY PCR - Abnormal; Notable for the following components:  ?    Result Value  ? Group A Strep by PCR DETECTED (*)   ? All other components within normal limits  ?RESP PANEL BY RT-PCR (FLU A&B, COVID) [id number]  ? ? ? ? ?PROCEDURES: ? ?Critical Care performed:  ? ?Procedures ? ? ?MEDICATIONS ORDERED IN ED: ?Medications  ?Ampicillin-Sulbactam (UNASYN) 3 g in sodium chloride 0.9 % 100 mL IVPB (0 g Intravenous Stopped 01/23/22 1149)  ?dexamethasone (DECADRON) injection 10 mg (10 mg Intravenous Given 01/23/22 1107)  ? ? ? ?IMPRESSION / MDM / ASSESSMENT AND PLAN / ED COURSE  ?I reviewed the triage vital signs and the nursing notes. ? ? ?Differential diagnosis includes, but is not limited to, COVID, influenza, strep pharyngitis, peritonsillar abscess, peritonsillar cellulitis, tonsillitis. ? ? ?31 year old male presents to the ED with complaint of sore throat and fever for the last 2 days.  On physical exam pharynx is erythematous and tonsils are edematous without midline shift of the uvula.  Patient is able  to maintain secretions without difficulty.  Patient was reassured when COVID and influenza were negative.  Patient was made aware that his strep test was positive and that he is contagious.  Patient states he has pain even with swallowing water.  After talking with him it was decided that his first dose of antibiotics would be in the emergency department IV.  Unasyn 3 g and Decadron 10 mg IV was given while in the ED.  Patient is aware that antibiotics were sent to his pharmacy.  He is encouraged to drink fluids and continue with Tylenol or ibuprofen as needed for body aches or fever.  He is to follow-up with Dr. Andee Poles if not improving who is on-call for [facility]. ? ? ?  ? ? ?FINAL CLINICAL IMPRESSION(S) / ED DIAGNOSES  ? ?Final diagnoses:  ?Strep pharyngitis  ? ? ? ?Rx / DC Orders  ? ?ED Discharge Orders   ? ?      Ordered  ?  amoxicillin (AMOXIL) 875 MG tablet  2 times daily       ? 01/23/22 1158  ? ?  ?  ? ?   ? ? ? ?Note:  This document was prepared using Dragon voice recognition software and may include unintentional dictation errors. ?  ?Tommi Rumps, PA-C ?01/23/22 1246 ? ?  ?Chesley Noon, MD ?01/23/22 1855 ? ?

## 2022-01-23 NOTE — Discharge Instructions (Addendum)
Follow-up with your primary care provider as needed.  Call make an appointment with Dr. Andee Poles who is on-call for [facility] if not improving or actually getting worse.  A prescription for amoxicillin 875 was sent to your pharmacy to be taken twice a day for the next 10 days.  You may continue taking Tylenol or ibuprofen as needed with this medication.  Drink fluids frequently to stay hydrated.  You may need to switch to soft foods such as yogurt, ice cream and applesauce until you are able to swallow without pain.  This should be temporary since you got IV antibiotics in the ED. ?

## 2023-04-18 ENCOUNTER — Ambulatory Visit (INDEPENDENT_AMBULATORY_CARE_PROVIDER_SITE_OTHER): Payer: Medicaid Other | Admitting: Internal Medicine

## 2023-04-18 VITALS — BP 138/78 | HR 88 | Resp 18 | Ht 74.0 in | Wt 301.0 lb

## 2023-04-18 DIAGNOSIS — K219 Gastro-esophageal reflux disease without esophagitis: Secondary | ICD-10-CM | POA: Diagnosis not present

## 2023-04-18 DIAGNOSIS — E669 Obesity, unspecified: Secondary | ICD-10-CM | POA: Diagnosis not present

## 2023-04-18 DIAGNOSIS — F419 Anxiety disorder, unspecified: Secondary | ICD-10-CM

## 2023-04-18 DIAGNOSIS — G4710 Hypersomnia, unspecified: Secondary | ICD-10-CM

## 2023-04-18 NOTE — Progress Notes (Signed)
3Sleep Medicine   Office Visit  Patient Name: Craig Frazier DOB: [birth year] MRN [id number]    Chief Complaint: Sleep consult  Brief History:  Marquez presents for an initial consult for sleep evaluation and to establish care. Patient has a at least 1 to 2 years history of excessive daytime sleepiness. Sleep quality is poor. This is noted every nights. The patient's bed partner reports  snoring and gasping at night. The patient relates the following symptoms: some headaches, fatigue, trouble concentrating and brain fogginess are also present. The patient goes to sleep between 5:00 and 7:00 pm and wakes up between 0400 am to 0600 am and will wake up a few times in between.  Sleep quality is the same when outside home environment.  Patient has noted significant movement of his legs at night that would disrupt his sleep.  The patient  relates no unusual behavior during the night.  The patient relates anxiety as a history of psychiatric problems. The Epworth Sleepiness Score is 19 out of 24 .  The patient relates  Cardiovascular risk factors include: none.    ROS  General: (-) fever, (-) chills, (-) night sweat Nose and Sinuses: (-) nasal stuffiness or itchiness, (-) postnasal drip, (-) nosebleeds, (-) sinus trouble. Mouth and Throat: (-) sore throat, (-) hoarseness. Neck: (-) swollen glands, (-) enlarged thyroid, (-) neck pain. Respiratory: - cough, - shortness of breath, - wheezing. Neurologic: + numbness, + tingling. Psychiatric: + anxiety, - depression Sleep behavior: -sleep paralysis -hypnogogic hallucinations -dream enactment      -vivid dreams -cataplexy -night terrors -sleep walking   Current Medication: Outpatient Encounter Medications as of 04/18/2023  Medication Sig   Ascorbic Acid (VITAMIN C) 1000 MG tablet Take 1,000 mg by mouth daily.   cetirizine (ZYRTEC) 10 MG chewable tablet Chew 10 mg by mouth daily.   escitalopram (LEXAPRO) 20 MG tablet Take by mouth.   ferrous  sulfate 324 MG TBEC Take 324 mg by mouth.   sildenafil (VIAGRA) 50 MG tablet Take by mouth.   famotidine (PEPCID) 20 MG tablet Take by mouth.   [DISCONTINUED] amoxicillin (AMOXIL) 875 MG tablet Take 1 tablet (875 mg total) by mouth 2 (two) times daily.   No facility-administered encounter medications on file as of 04/18/2023.    Surgical History: History reviewed. No pertinent surgical history.  Medical History: History reviewed. No pertinent past medical history.  Family History: Non contributory to the present illness  Social History: Social History   Socioeconomic History   Marital status: Married    Spouse name: Not on file   Number of children: Not on file   Years of education: Not on file   Highest education level: Not on file  Occupational History   Not on file  Tobacco Use   Smoking status: Former    Types: Cigarettes   Smokeless tobacco: Current    Types: Snuff  Vaping Use   Vaping Use: Every day  Substance and Sexual Activity   Alcohol use: Yes   Drug use: Not on file   Sexual activity: Not on file  Other Topics Concern   Not on file  Social History Narrative   Not on file   Social Determinants of Health   Financial Resource Strain: Not on file  Food Insecurity: Not on file  Transportation Needs: Not on file  Physical Activity: Not on file  Stress: Not on file  Social Connections: Not on file  Intimate Partner Violence: Not on file  Vital Signs: Blood pressure 138/78, pulse 88, resp. rate 18, height 6\' 2"  (1.88 m), weight (!) 301 lb (136.5 kg), SpO2 97 %. Body mass index is 38.65 kg/m.   Examination: General Appearance: The patient is well-developed, well-nourished, and in no distress. Neck Circumference: 47 cm Skin: Gross inspection of skin unremarkable. Head: normocephalic, no gross deformities. Eyes: no gross deformities noted. ENT: ears appear grossly normal Neurologic: Alert and oriented. No involuntary movements.    STOP BANG  RISK ASSESSMENT S (snore) Have you been told that you snore?     YES   T (tired) Are you often tired, fatigued, or sleepy during the day?   YES  O (obstruction) Do you stop breathing, choke, or gasp during sleep? YES   P (pressure) Do you have or are you being treated for high blood pressure? NO   B (BMI) Is your body index greater than 35 kg/m? NO   A (age) Are you 34 years old or older? NO   N (neck) Do you have a neck circumference greater than 16 inches?   YES   G (gender) Are you a male? YES   TOTAL STOP/BANG "YES" ANSWERS 5                                                               A STOP-Bang score of 2 or less is considered low risk, and a score of 5 or more is high risk for having either moderate or severe OSA. For people who score 3 or 4, doctors may need to perform further assessment to determine how likely they are to have OSA.         EPWORTH SLEEPINESS SCALE:  Scale:  (0)= no chance of dozing; (1)= slight chance of dozing; (2)= moderate chance of dozing; (3)= high chance of dozing  Chance  Situtation    Sitting and reading: 3    Watching TV: 3    Sitting Inactive in public: 2    As a passenger in car: 2      Lying down to rest: 3    Sitting and talking: 2    Sitting quielty after lunch: 3    In a car, stopped in traffic: 1   TOTAL SCORE:   19 out of 24    SLEEP STUDIES:  None   LABS: No results found for this or any previous visit (from the past 2160 hour(s)).  Radiology: No results found.  No results found.  No results found.    Assessment and Plan: Patient Active Problem List   Diagnosis Date Noted   Anxiety 04/18/2023   Gastroesophageal reflux disease 04/18/2023     PLAN OSA:   Patient evaluation suggests high risk of sleep disordered breathing due to snoring, gasping, some headaches, fatigue, trouble concentrating, brain fogginess, and elevated BMI.   Suggest: PSG to assess/treat the patient's sleep disordered  breathing. The patient was also counselled on wt loss to optimize sleep health.   1. Hypersomnia Will order PSG  2. Anxiety Continue current medication and f/u with PCP.  3. Gastroesophageal reflux disease, unspecified whether esophagitis present Continue current medication and f/u with PCP.  4. Obesity (BMI 30-39.9) Obesity Counseling: Had a lengthy discussion regarding patients BMI and weight issues. Patient was instructed on  portion control as well as increased activity. Also discussed caloric restrictions with trying to maintain intake less than 2000 Kcal. Discussions were made in accordance with the 5As of weight management. Simple actions such as not eating late and if able to, taking a walk is suggested.    General Counseling: I have discussed the findings of the evaluation and examination with Ree Kida.  I have also discussed any further diagnostic evaluation thatmay be needed or ordered today. Jatavious verbalizes understanding of the findings of todays visit. We also reviewed his medications today and discussed drug interactions and side effects including but not limited excessive drowsiness and altered mental states. We also discussed that there is always a risk not just to him but also people around him. he has been encouraged to call the office with any questions or concerns that should arise related to todays visit.  No orders of the defined types were placed in this encounter.       I have personally obtained a history, evaluated the patient, evaluated pertinent data, formulated the assessment and plan and placed orders.  This patient was seen by Lynn Ito, PA-C in collaboration with Dr. Freda Munro as a part of collaborative care agreement.    Yevonne Pax, MD [facility] Diplomate ABMS Pulmonary and Critical Care Medicine Sleep medicine

## 2023-12-03 NOTE — Progress Notes (Unsigned)
[facility] [address] [place], [place] [address]  Pulmonary Sleep Medicine   Office Visit Note  Patient Name: Craig Frazier DOB: 03/17/1991 MRN [id number]    Chief Complaint: Obstructive Sleep Apnea visit  Brief History:  Beckem is seen today for a follow up visit for APAP@ 5-20 cmH2O. The patient has a 6 month history of sleep apnea. Patient is using PAP nightly.  The patient feels rested after sleeping with PAP.  The patient reports benefiting from PAP use. Reported sleepiness is  improved and the Epworth Sleepiness Score is 13 out of 24. The patient does not take naps. The patient complains of the following: none.  The compliance download shows 77% compliance with an average use time of 5 hours 5 minutes. The AHI is 5.1.  The patient does not complain of limb movements disrupting sleep. The patient continues to require PAP therapy in order to eliminate sleep apnea.   ROS  General: (-) fever, (-) chills, (-) night sweat Nose and Sinuses: (-) nasal stuffiness or itchiness, (-) postnasal drip, (-) nosebleeds, (-) sinus trouble. Mouth and Throat: (-) sore throat, (-) hoarseness. Neck: (-) swollen glands, (-) enlarged thyroid, (-) neck pain. Respiratory: - cough, - shortness of breath, - wheezing. Neurologic: - numbness, - tingling. Psychiatric: + anxiety, - depression   Current Medication: Outpatient Encounter Medications as of 12/04/2023  Medication Sig   Ascorbic Acid (VITAMIN C) 1000 MG tablet Take 1,000 mg by mouth daily.   cetirizine (ZYRTEC) 10 MG chewable tablet Chew 10 mg by mouth daily.   escitalopram (LEXAPRO) 20 MG tablet Take by mouth.   famotidine (PEPCID) 20 MG tablet Take by mouth.   ferrous sulfate 324 MG TBEC Take 324 mg by mouth.   sildenafil (VIAGRA) 50 MG tablet Take by mouth.   No facility-administered encounter medications on file as of 12/04/2023.    Surgical History: History reviewed. No pertinent surgical history.  Medical  History: History reviewed. No pertinent past medical history.  Family History: Non contributory to the present illness  Social History: Social History   Socioeconomic History   Marital status: Married    Spouse name: Not on file   Number of children: Not on file   Years of education: Not on file   Highest education level: Not on file  Occupational History   Not on file  Tobacco Use   Smoking status: Former    Types: Cigarettes   Smokeless tobacco: Current    Types: Snuff  Vaping Use   Vaping status: Every Day  Substance and Sexual Activity   Alcohol use: Yes   Drug use: Not on file   Sexual activity: Not on file  Other Topics Concern   Not on file  Social History Narrative   Not on file   Social Drivers of Health   Financial Resource Strain: Low Risk  (02/07/2023)   Received from [facility], [facility]   Overall Financial Resource Strain (CARDIA)    Difficulty of Paying Living Expenses: Not very hard  Food Insecurity: No Food Insecurity (02/07/2023)   Received from [facility], [facility]   Hunger Vital Sign    Worried About Running Out of Food in the Last Year: Never true    Ran Out of Food in the Last Year: Never true  Transportation Needs: No Transportation Needs (02/07/2023)   Received from [facility], [facility]   [facility] - Transportation  In the past 12 months, has lack of transportation kept you from medical appointments or from getting medications?: No    Lack of Transportation (Non-Medical): No  Physical Activity: Not on file  Stress: Not on file  Social Connections: Not on file  Intimate Partner Violence: Not on file    Vital Signs: Blood pressure 132/78, pulse 87, resp. rate 16, height 6\' 1"  (1.854 m), weight (!) 310 lb (140.6 kg), SpO2 98%. Body mass index is 40.9 kg/m.    Examination: General Appearance: The patient is well-developed,  well-nourished, and in no distress. Neck Circumference: 47 cm Skin: Gross inspection of skin unremarkable. Head: normocephalic, no gross deformities. Eyes: no gross deformities noted. ENT: ears appear grossly normal Neurologic: Alert and oriented. No involuntary movements.  STOP BANG RISK ASSESSMENT S (snore) Have you been told that you snore?     NO   T (tired) Are you often tired, fatigued, or sleepy during the day?   NO  O (obstruction) Do you stop breathing, choke, or gasp during sleep? NO   P (pressure) Do you have or are you being treated for high blood pressure? NO   B (BMI) Is your body index greater than 35 kg/m? YES   A (age) Are you 51 years old or older? NO   N (neck) Do you have a neck circumference greater than 16 inches?   NO   G (gender) Are you a male? NO  TOTAL STOP/BANG "YES" ANSWERS 1       A STOP-Bang score of 2 or less is considered low risk, and a score of 5 or more is high risk for having either moderate or severe OSA. For people who score 3 or 4, doctors may need to perform further assessment to determine how likely they are to have OSA.         EPWORTH SLEEPINESS SCALE:  Scale:  (0)= no chance of dozing; (1)= slight chance of dozing; (2)= moderate chance of dozing; (3)= high chance of dozing  Chance  Situtation    Sitting and reading: 2    Watching TV: 1    Sitting Inactive in public: 1    As a passenger in car: 2      Lying down to rest: 2    Sitting and talking: 2    Sitting quielty after lunch: 3    In a car, stopped in traffic: 0   TOTAL SCORE:   13 out of 24    SLEEP STUDIES:  HST (05/24/2023)  REI 18.3/hr  Supine REI 40.5/hr  SP02 min 83%   CPAP COMPLIANCE DATA:  Date Range: 09/02/2023-11/30/2023  Average Daily Use: 5 hours 5 minutes  Median Use: 4 hours 56 minutes  Compliance for > 4 Hours: 77%  AHI: 5.1 respiratory events per hour  Days Used: 89/90 days  Mask Leak: 28.7  95th Percentile Pressure:  13.4         LABS: No results found for this or any previous visit (from the past 2160 hours).  Radiology: No results found.  No results found.  No results found.    Assessment and Plan: Patient Active Problem List   Diagnosis Date Noted   Anxiety 04/18/2023   Gastroesophageal reflux disease 04/18/2023   1. OSA (obstructive sleep apnea) (Primary) The patient does tolerate PAP and reports  benefit from PAP use. The patient was reminded how to clean equipment and advised to replace supplies more frequently- he has mask leak and has not been  changing seal. . The patient was also counselled on weight loss. The compliance is fair. The AHI is 5.1.   OSA on cpap-  borderline controlled. Continue with compliance with pap. CPAP continues to be medically necessary to treat this patient's OSA. F/u 39m   2. CPAP use counseling CPAP Counseling: had a lengthy discussion with the patient regarding the importance of PAP therapy in management of the sleep apnea. Patient appears to understand the risk factor reduction and also understands the risks associated with untreated sleep apnea. Patient will try to make a good faith effort to remain compliant with therapy. Also instructed the patient on proper cleaning of the device including the water must be changed daily if possible and use of distilled water is preferred. Patient understands that the machine should be regularly cleaned with appropriate recommended cleaning solutions that do not damage the PAP machine for example given white vinegar and water rinses. Other methods such as ozone treatment may not be as good as these simple methods to achieve cleaning.   3. Morbid obesity (HCC) Obesity Counseling: Had a lengthy discussion regarding patients BMI and weight issues. Patient was instructed on portion control as well as increased activity. Also discussed caloric restrictions with trying to maintain intake less than 2000 Kcal. Discussions were  made in accordance with the 5As of weight management. Simple actions such as not eating late and if able to, taking a walk is suggested.     General Counseling: I have discussed the findings of the evaluation and examination with Ree Kida.  I have also discussed any further diagnostic evaluation thatmay be needed or ordered today. Haron verbalizes understanding of the findings of todays visit. We also reviewed his medications today and discussed drug interactions and side effects including but not limited excessive drowsiness and altered mental states. We also discussed that there is always a risk not just to him but also people around him. he has been encouraged to call the office with any questions or concerns that should arise related to todays visit.  No orders of the defined types were placed in this encounter.       I have personally obtained a history, examined the patient, evaluated laboratory and imaging results, formulated the assessment and plan and placed orders. This patient was seen today by Emmaline Kluver, PA-C in collaboration with Dr. Freda Munro.   Yevonne Pax, MD [facility] Diplomate ABMS Pulmonary Critical Care Medicine and Sleep Medicine

## 2023-12-04 ENCOUNTER — Ambulatory Visit (INDEPENDENT_AMBULATORY_CARE_PROVIDER_SITE_OTHER): Payer: Medicaid Other | Admitting: Internal Medicine

## 2023-12-04 VITALS — BP 132/78 | HR 87 | Resp 16 | Ht 73.0 in | Wt 310.0 lb

## 2023-12-04 DIAGNOSIS — G4733 Obstructive sleep apnea (adult) (pediatric): Secondary | ICD-10-CM | POA: Diagnosis not present

## 2023-12-04 DIAGNOSIS — Z7189 Other specified counseling: Secondary | ICD-10-CM | POA: Diagnosis not present

## 2023-12-04 NOTE — Patient Instructions (Signed)
